# Patient Record
Sex: Male | Born: 1950 | Race: White | Hispanic: No | Marital: Married | Smoking: Never smoker
Health system: Southern US, Community
[De-identification: ages and names within clinical notes are randomized; demographics above are authoritative.]

## PROBLEM LIST (undated history)

## (undated) DIAGNOSIS — I10 Essential (primary) hypertension: Secondary | ICD-10-CM

## (undated) DIAGNOSIS — Z87442 Personal history of urinary calculi: Secondary | ICD-10-CM

## (undated) DIAGNOSIS — L570 Actinic keratosis: Secondary | ICD-10-CM

## (undated) DIAGNOSIS — H33312 Horseshoe tear of retina without detachment, left eye: Secondary | ICD-10-CM

## (undated) DIAGNOSIS — K219 Gastro-esophageal reflux disease without esophagitis: Secondary | ICD-10-CM

## (undated) HISTORY — PX: EYE SURGERY: SHX253

## (undated) HISTORY — PX: LITHOTRIPSY: SUR834

## (undated) HISTORY — PX: CHOLECYSTECTOMY: SHX55

## (undated) HISTORY — DX: Actinic keratosis: L57.0

## (undated) HISTORY — PX: VASECTOMY: SHX75

---

## 1993-02-18 HISTORY — PX: FOOT SURGERY: SHX648

## 2005-07-16 ENCOUNTER — Emergency Department: Payer: Self-pay | Admitting: Emergency Medicine

## 2018-11-29 ENCOUNTER — Other Ambulatory Visit: Payer: Self-pay

## 2018-11-29 ENCOUNTER — Emergency Department: Payer: 59

## 2018-11-29 ENCOUNTER — Emergency Department
Admission: EM | Admit: 2018-11-29 | Discharge: 2018-11-29 | Disposition: A | Discharge status: Home / Self Care | Payer: 59 | Attending: Emergency Medicine | Admitting: Emergency Medicine

## 2018-11-29 DIAGNOSIS — N200 Calculus of kidney: Secondary | ICD-10-CM | POA: Diagnosis not present

## 2018-11-29 DIAGNOSIS — N23 Unspecified renal colic: Secondary | ICD-10-CM | POA: Diagnosis not present

## 2018-11-29 DIAGNOSIS — Z79899 Other long term (current) drug therapy: Secondary | ICD-10-CM | POA: Diagnosis not present

## 2018-11-29 DIAGNOSIS — R1032 Left lower quadrant pain: Secondary | ICD-10-CM | POA: Diagnosis present

## 2018-11-29 LAB — CBC WITH DIFFERENTIAL/PLATELET
Abs Immature Granulocytes: 0.05 10*3/uL (ref 0.00–0.07)
Basophils Absolute: 0.1 10*3/uL (ref 0.0–0.1)
Basophils Relative: 1 %
Eosinophils Absolute: 0.6 10*3/uL — ABNORMAL HIGH (ref 0.0–0.5)
Eosinophils Relative: 6 %
HCT: 43.9 % (ref 39.0–52.0)
Hemoglobin: 15 g/dL (ref 13.0–17.0)
Immature Granulocytes: 1 %
Lymphocytes Relative: 28 %
Lymphs Abs: 2.6 10*3/uL (ref 0.7–4.0)
MCH: 31.4 pg (ref 26.0–34.0)
MCHC: 34.2 g/dL (ref 30.0–36.0)
MCV: 92 fL (ref 80.0–100.0)
Monocytes Absolute: 0.7 10*3/uL (ref 0.1–1.0)
Monocytes Relative: 8 %
Neutro Abs: 5.4 10*3/uL (ref 1.7–7.7)
Neutrophils Relative %: 56 %
Platelets: 168 10*3/uL (ref 150–400)
RBC: 4.77 MIL/uL (ref 4.22–5.81)
RDW: 11.6 % (ref 11.5–15.5)
WBC: 9.3 10*3/uL (ref 4.0–10.5)
nRBC: 0 % (ref 0.0–0.2)

## 2018-11-29 LAB — URINALYSIS, COMPLETE (UACMP) WITH MICROSCOPIC
Bacteria, UA: NONE SEEN
Bilirubin Urine: NEGATIVE
Glucose, UA: NEGATIVE mg/dL
Ketones, ur: NEGATIVE mg/dL
Leukocytes,Ua: NEGATIVE
Nitrite: NEGATIVE
Protein, ur: 30 mg/dL — AB
RBC / HPF: >50 RBC/hpf — ABNORMAL HIGH (ref 0–5)
Specific Gravity, Urine: 1.026 (ref 1.005–1.030)
Squamous Epithelial / HPF: NONE SEEN (ref 0–5)
pH: 5 (ref 5.0–8.0)

## 2018-11-29 LAB — BASIC METABOLIC PANEL
Anion gap: 9 (ref 5–15)
BUN: 22 mg/dL (ref 8–23)
CO2: 23 mmol/L (ref 22–32)
Calcium: 9 mg/dL (ref 8.9–10.3)
Chloride: 105 mmol/L (ref 98–111)
Creatinine, Ser: 0.84 mg/dL (ref 0.61–1.24)
GFR calc Af Amer: >60 mL/min (ref >60)
GFR calc non Af Amer: >60 mL/min (ref >60)
Glucose, Bld: 127 mg/dL — ABNORMAL HIGH (ref 70–99)
Potassium: 3.9 mmol/L (ref 3.5–5.1)
Sodium: 137 mmol/L (ref 135–145)

## 2018-11-29 MED ORDER — IBUPROFEN 600 MG PO TABS: 600.0000 mg | ORAL_TABLET | Freq: Three times a day (TID) | ORAL | 0 refills | Status: DC | PRN

## 2018-11-29 MED ORDER — HYDROCODONE-ACETAMINOPHEN 5-325 MG PO TABS: 1.0000 | ORAL_TABLET | ORAL | 0 refills | Status: DC | PRN

## 2018-11-29 MED ORDER — ONDANSETRON 4 MG PO TBDP: 4.0000 mg | ORAL_TABLET | Freq: Three times a day (TID) | ORAL | 0 refills | Status: DC | PRN

## 2018-11-29 MED ORDER — KETOROLAC TROMETHAMINE 30 MG/ML IJ SOLN
15.0000 mg | Freq: Once | INTRAMUSCULAR | Status: AC
  Administered 2018-11-29: 15 mg via INTRAVENOUS
  Filled 2018-11-29: qty 1

## 2018-11-29 MED ORDER — SODIUM CHLORIDE 0.9 % IV BOLUS
1000.0000 mL | Freq: Once | INTRAVENOUS | Status: AC
  Administered 2018-11-29: 1000 mL via INTRAVENOUS

## 2018-11-29 NOTE — ED Triage Notes (Signed)
C/O left flank pain.  Initially pain started last night, but has worsened today.  Also has been unable to void since last night.  Patient states he has had kidney stones in the past and symptoms are similar to that.

## 2018-11-29 NOTE — ED Triage Notes (Signed)
Pt reports a tinge of pain last night that returned this am at 0900, pt states that his left flank is where pt reports he has been unable to void since last pm, only dribbiling

## 2018-11-29 NOTE — ED Notes (Signed)
Patient transported to CT 

## 2018-11-29 NOTE — ED Provider Notes (Signed)
[facility] Emergency Department Provider Note  ____________________________________________   First MD Initiated Contact with Patient 11/29/18 1053     (approximate)  I have reviewed the triage vital signs and the nursing notes.   HISTORY  Chief Complaint Flank Pain    HPI FINDLEY VI is a 68 y.o. male here with transient flank pain.   Patient states that starting yesterday/last evening, he developed initially mild left flank pain.  Since then, he has had recurrence and worsening of severe left flank pain.  He states that he has had associated nausea but no vomiting.  He feels like the pain is aching, gnawing.  Has history of kidney stones with similar symptoms.  He denies any fever or chills.  No recent cough.  No other complaints.  He states that since arriving in the ED and receiving medication, he feels improved.  No history of infected stones.  He has required a stent in the past, but it has been a significant time.  No recent medication changes.  No cough.  No other illness.       No past medical history on file.   PMHx: Kidney stones  PShx:  H/o prior stenting/lithotripsy  SHx: Non smoker No ETOH regularly  There are no active problems to display for this patient.    Prior to Admission medications   Medication Sig Start Date End Date Taking? Authorizing Provider  HYDROcodone-acetaminophen (NORCO/VICODIN) 5-325 MG tablet Take 1-2 tablets by mouth every 4 (four) hours as needed for moderate pain or severe pain. 11/29/18 11/29/19  Duffy Bruce, MD  ibuprofen (ADVIL) 600 MG tablet Take 1 tablet (600 mg total) by mouth every 8 (eight) hours as needed for moderate pain. 11/29/18   Duffy Bruce, MD  ondansetron (ZOFRAN ODT) 4 MG disintegrating tablet Take 1 tablet (4 mg total) by mouth every 8 (eight) hours as needed for nausea or vomiting. 11/29/18   Duffy Bruce, MD    Allergies Penicillins  No family history on file.   Social History Social History   Tobacco Use  . Smoking status: Not on file  Substance Use Topics  . Alcohol use: Not on file  . Drug use: Not on file    Review of Systems  Review of Systems  Constitutional: Positive for fatigue. Negative for chills and fever.  HENT: Negative for sore throat.   Respiratory: Negative for shortness of breath.   Cardiovascular: Negative for chest pain.  Gastrointestinal: Negative for abdominal pain.  Genitourinary: Positive for flank pain and frequency.  Musculoskeletal: Negative for neck pain.  Skin: Negative for rash and wound.  Allergic/Immunologic: Negative for immunocompromised state.  Neurological: Negative for weakness and numbness.  Hematological: Does not bruise/bleed easily.  All other systems reviewed and are negative.    ____________________________________________  PHYSICAL EXAM:      VITAL SIGNS: ED Triage Vitals  Enc Vitals Group     BP 11/29/18 1040 (!) 146/89     Pulse Rate 11/29/18 1040 62     Resp 11/29/18 1040 16     Temp --      Temp src --      SpO2 11/29/18 1040 99 %     Weight 11/29/18 1030 206 lb (93.4 kg)     Height 11/29/18 1030 5\' 11"  (1.803 m)     Head Circumference --      Peak Flow --      Pain Score 11/29/18 1029 10     Pain Loc --  Pain Edu? --      Excl. in GC? --      Physical Exam Vitals signs and nursing note reviewed.  Constitutional:      General: He is not in acute distress.    Appearance: He is well-developed.  HENT:     Head: Normocephalic and atraumatic.  Eyes:     Conjunctiva/sclera: Conjunctivae normal.  Neck:     Musculoskeletal: Neck supple.  Cardiovascular:     Rate and Rhythm: Normal rate and regular rhythm.     Heart sounds: Normal heart sounds. No murmur. No friction rub.  Pulmonary:     Effort: Pulmonary effort is normal. No respiratory distress.     Breath sounds: Normal breath sounds. No wheezing or rales.  Abdominal:     General: Abdomen is flat. There is no  distension.     Palpations: Abdomen is soft.     Tenderness: There is no abdominal tenderness.     Comments: No CVA tenderness bilaterally.  No focal tenderness.  No rebound or guarding.  Skin:    General: Skin is warm.     Capillary Refill: Capillary refill takes less than 2 seconds.  Neurological:     Mental Status: He is alert and oriented to person, place, and time.     Motor: No abnormal muscle tone.       ____________________________________________   LABS (all labs ordered are listed, but only abnormal results are displayed)  Labs Reviewed  CBC WITH DIFFERENTIAL/PLATELET - Abnormal; Notable for the following components:      Result Value   Eosinophils Absolute 0.6 (*)    All other components within normal limits  BASIC METABOLIC PANEL - Abnormal; Notable for the following components:   Glucose, Bld 127 (*)    All other components within normal limits  URINALYSIS, COMPLETE (UACMP) WITH MICROSCOPIC - Abnormal; Notable for the following components:   Color, Urine YELLOW (*)    APPearance HAZY (*)    Hgb urine dipstick LARGE (*)    Protein, ur 30 (*)    RBC / HPF >50 (*)    All other components within normal limits    ____________________________________________  EKG: None ________________________________________  RADIOLOGY All imaging, including plain films, CT scans, and ultrasounds, independently reviewed by me, and interpretations confirmed via formal radiology reads.  ED MD interpretation:   CT stone: Bladder stone noted with mild dilation of the left ureter, consistent with likely recently passed stone  Official radiology report(s): Ct Renal Stone Study  Result Date: 11/29/2018 CLINICAL DATA:  Pt states he woke up at 2am with severe left sided flank pain, walking eased it off, severe pain returned at 9am. States hx kidney stones x4 left side most recent 6010yrs ago. EXAM: CT ABDOMEN AND PELVIS WITHOUT CONTRAST TECHNIQUE: Multidetector CT imaging of the abdomen  and pelvis was performed following the standard protocol without IV contrast. COMPARISON:  None. FINDINGS: Lower chest: Small ground-glass opacity in the peripheral right lower lobe (series 2, image 12), may represent a focus of atelectasis but early infection or inflammation not excluded. Somewhat limited evaluation of the abdominal viscera given the lack of IV contrast. Hepatobiliary: No focal liver abnormality is seen. Status post cholecystectomy. No biliary dilatation. Pancreas: Unremarkable. Spleen: Normal in size without focal abnormality. Adrenals/Urinary Tract: Adrenal glands are unremarkable. Kidneys are symmetric in size. No hydronephrosis. There is a 5 mm calculus in the right kidney. No renal calculi in the left kidney. The distal aspect of the left  ureter is minimally distended without an obstructing calculus. There is a 3 mm calculus in the dependent portion of the bladder. Stomach/Bowel: Stomach is within normal limits. Appendix appears normal. No evidence of bowel wall thickening, distention, or inflammatory changes. Multiple colonic diverticula without evidence of diverticulitis. Vascular/Lymphatic: Mild scattered atherosclerotic calcification without evidence of aneurysm. Reproductive: Prostate is unremarkable. Other: No abdominal wall hernia or abnormality. No abdominopelvic ascites. Musculoskeletal: No acute or significant osseous findings. IMPRESSION: 1. The distal aspect of the left ureter is minimally distended without an obstructing calculus. There is a 3 mm calculus in the dependent portion the bladder, possibly representing a recently passed stone. No left hydronephrosis. 2. 5 mm nonobstructing calculus in the right kidney. 3. Small ground-glass opacity in the peripheral right lower lobe (series 2, image 12), may represent a focus of atelectasis but early infection or inflammation not excluded. Consider short-term follow-up chest radiograph. 4. Colonic diverticulosis without evidence of  diverticulitis. Aortic Atherosclerosis (ICD10-I70.0). Electronically Signed   By: Emmaline Kluver M.D.   On: 11/29/2018 12:05    ____________________________________________  PROCEDURES   Procedure(s) performed (including Critical Care):  Procedures  ____________________________________________  INITIAL IMPRESSION / MDM / ASSESSMENT AND PLAN / ED COURSE  As part of my medical decision making, I reviewed the following data within the electronic MEDICAL RECORD NUMBER       *RODRIGUES URBANEK was evaluated in Emergency Department on 11/29/2018 for the symptoms described in the history of present illness. He was evaluated in the context of the global COVID-19 pandemic, which necessitated consideration that the patient might be at risk for infection with the SARS-CoV-2 virus that causes COVID-19. Institutional protocols and algorithms that pertain to the evaluation of patients at risk for COVID-19 are in a state of rapid change based on information released by regulatory bodies including the CDC and federal and state organizations. These policies and algorithms were followed during the patient's care in the ED.  Some ED evaluations and interventions may be delayed as a result of limited staffing during the pandemic.*      Medical Decision Making: 68 year old male here with left flank pain, now resolved.  Suspect passed stone.  There is a 3 mm stone in the bladder with mild ureter dilation consistent with recent passage.  His pain is now resolved.  He has no significant pyuria.  He is afebrile with no evidence to suggest infected stone or pyelonephritis.  No testicular pain or swelling.  Incidental note is made of possible atelectasis versus infection in his right lower lobe, but he has absolutely no fever, cough, shortness of breath, and I suspect this is more so atelectasis due to decreased movement from his pain.  Given otherwise well appearance with no signs of ongoing pain or obstruction, will  discharge with outpatient follow-up.  ____________________________________________  FINAL CLINICAL IMPRESSION(S) / ED DIAGNOSES  Final diagnoses:  Renal colic  Nephrolithiasis     MEDICATIONS GIVEN DURING THIS VISIT:  Medications  ketorolac (TORADOL) 30 MG/ML injection 15 mg (15 mg Intravenous Given 11/29/18 1113)  sodium chloride 0.9 % bolus 1,000 mL (0 mLs Intravenous Stopped 11/29/18 1242)     ED Discharge Orders         Ordered    HYDROcodone-acetaminophen (NORCO/VICODIN) 5-325 MG tablet  Every 4 hours PRN     11/29/18 1319    ondansetron (ZOFRAN ODT) 4 MG disintegrating tablet  Every 8 hours PRN     11/29/18 1319    ibuprofen (ADVIL) 600 MG  tablet  Every 8 hours PRN     11/29/18 1319           Note:  This document was prepared using Dragon voice recognition software and may include unintentional dictation errors.   Shaune Pollack, MD 11/29/18 1320

## 2019-01-05 ENCOUNTER — Other Ambulatory Visit: Payer: Self-pay | Admitting: Podiatry

## 2019-01-05 DIAGNOSIS — S86312D Strain of muscle(s) and tendon(s) of peroneal muscle group at lower leg level, left leg, subsequent encounter: Secondary | ICD-10-CM

## 2019-01-18 ENCOUNTER — Ambulatory Visit
Admission: RE | Admit: 2019-01-18 | Discharge: 2019-01-18 | Disposition: A | Discharge status: Home / Self Care | Payer: 59 | Source: Ambulatory Visit | Attending: Podiatry | Admitting: Podiatry

## 2019-01-18 ENCOUNTER — Other Ambulatory Visit: Payer: Self-pay

## 2019-01-18 DIAGNOSIS — S86312D Strain of muscle(s) and tendon(s) of peroneal muscle group at lower leg level, left leg, subsequent encounter: Secondary | ICD-10-CM | POA: Diagnosis not present

## 2019-01-21 ENCOUNTER — Other Ambulatory Visit: Payer: Self-pay | Admitting: Podiatry

## 2019-01-26 ENCOUNTER — Other Ambulatory Visit: Payer: Self-pay

## 2019-01-26 ENCOUNTER — Encounter
Admission: RE | Admit: 2019-01-26 | Discharge: 2019-01-26 | Disposition: A | Discharge status: Home / Self Care | Payer: 59 | Source: Ambulatory Visit | Attending: Podiatry | Admitting: Podiatry

## 2019-01-26 DIAGNOSIS — I4510 Unspecified right bundle-branch block: Secondary | ICD-10-CM | POA: Insufficient documentation

## 2019-01-26 DIAGNOSIS — Z20828 Contact with and (suspected) exposure to other viral communicable diseases: Secondary | ICD-10-CM | POA: Diagnosis not present

## 2019-01-26 DIAGNOSIS — Z01818 Encounter for other preprocedural examination: Secondary | ICD-10-CM | POA: Diagnosis not present

## 2019-01-26 DIAGNOSIS — I10 Essential (primary) hypertension: Secondary | ICD-10-CM | POA: Insufficient documentation

## 2019-01-26 HISTORY — DX: Horseshoe tear of retina without detachment, left eye: H33.312

## 2019-01-26 HISTORY — DX: Essential (primary) hypertension: I10

## 2019-01-26 HISTORY — DX: Personal history of urinary calculi: Z87.442

## 2019-01-26 HISTORY — DX: Gastro-esophageal reflux disease without esophagitis: K21.9

## 2019-01-26 LAB — SARS CORONAVIRUS 2 (TAT 6-24 HRS): SARS Coronavirus 2: NEGATIVE

## 2019-01-26 MED ORDER — FAMOTIDINE 20 MG PO TABS: 20.0000 mg | ORAL_TABLET | Freq: Once | ORAL | Status: DC

## 2019-01-26 NOTE — Patient Instructions (Signed)
Your procedure is scheduled on: January 29, 2019 Friday  Report to Day Surgery on the 2nd floor of the [facility]. To find out your arrival time, please call [phone] between 1PM - 3PM on: Thursday January 28, 2019   REMEMBER: Instructions that are not followed completely may result in serious medical risk, up to and including death; or upon the discretion of your surgeon and anesthesiologist your surgery may need to be rescheduled.  Do not eat food after midnight the night before surgery.  No gum chewing, lozengers or hard candies.  You may however, drink CLEAR liquids up to 2 hours before you are scheduled to arrive for your surgery. Do not drink anything within 2 hours of the start of your surgery.  Clear liquids include: - water  - apple juice without pulp -CLEAR  gatorade - black coffee or tea (Do NOT add milk or creamers to the coffee or tea) Do NOT drink anything that is not on this list.  Type 1 and Type 2 diabetics should only drink water.  ENSURE PRE-SURGERY CARBOHYDRATE DRINK:  Complete drinking 3 hours prior to surgery.  No Alcohol for 24 hours before or after surgery.  No Smoking including e-cigarettes for 24 hours prior to surgery.  No chewable tobacco products for at least 6 hours prior to surgery.  No nicotine patches on the day of surgery.  On the morning of surgery brush your teeth with toothpaste and water, you may rinse your mouth with mouthwash if you wish. Do not swallow any toothpaste or mouthwash.  Notify your doctor if there is any change in your medical condition (cold, fever, infection).  Do not wear jewelry, make-up, hairpins, clips or nail polish.  Do not wear lotions, powders, or perfumes.   Do not shave 48 hours prior to surgery.   Contacts and dentures may not be worn into surgery.  Do not bring valuables to the hospital, including drivers license, insurance or credit cards.  [facility] is not responsible for any belongings or  valuables.   TAKE THESE MEDICATIONS THE MORNING OF SURGERY: OMEPRAZOLE (take one the night before and one on the morning of surgery - helps to prevent nausea after surgery.)  Use CHG Soap as directed on instruction sheet.  Follow recommendations from Cardiologist, Pulmonologist or PCP regarding stopping Aspirin, Coumadin, Plavix, Eliquis, Pradaxa, or Pletal.  Stop Anti-inflammatories (NSAIDS) such as Advil, Aleve, Ibuprofen, Motrin, Naproxen, Naprosyn and Aspirin based products such as Excedrin, Goodys Powder, BC Powder. (May take Tylenol or Acetaminophen if needed.)  Stop ANY OVER THE COUNTER supplements until after surgery. (May continue Vitamin D, Vitamin B, and multivitamin.)  Wear comfortable clothing (specific to your surgery type) to the hospital.  Plan for stool softeners for home use.   If you are being discharged the day of surgery, you will not be allowed to drive home. You will need a responsible adult to drive you home and stay with you that night.   If you are taking public transportation, you will need to have a responsible adult with you. Please confirm with your physician that it is acceptable to use public transportation.   Please call [phone] if you have any questions about these instructions.

## 2019-01-28 MED ORDER — CLINDAMYCIN PHOSPHATE 900 MG/50ML IV SOLN
900.0000 mg | INTRAVENOUS | Status: AC
  Administered 2019-01-29: 900 mg via INTRAVENOUS

## 2019-01-29 ENCOUNTER — Encounter
Admission: RE | Disposition: A | Discharge status: Home / Self Care | Payer: Self-pay | Source: Home / Self Care | Attending: Podiatry

## 2019-01-29 ENCOUNTER — Other Ambulatory Visit: Payer: Self-pay

## 2019-01-29 ENCOUNTER — Encounter: Payer: Self-pay | Admitting: Podiatry

## 2019-01-29 ENCOUNTER — Ambulatory Visit
Admission: RE | Admit: 2019-01-29 | Discharge: 2019-01-29 | Disposition: A | Discharge status: Home / Self Care | Payer: 59 | Attending: Podiatry | Admitting: Podiatry

## 2019-01-29 ENCOUNTER — Inpatient Hospital Stay: Payer: 59 | Admitting: Certified Registered Nurse Anesthetist

## 2019-01-29 DIAGNOSIS — I10 Essential (primary) hypertension: Secondary | ICD-10-CM | POA: Insufficient documentation

## 2019-01-29 DIAGNOSIS — X58XXXD Exposure to other specified factors, subsequent encounter: Secondary | ICD-10-CM | POA: Diagnosis not present

## 2019-01-29 DIAGNOSIS — Z7951 Long term (current) use of inhaled steroids: Secondary | ICD-10-CM | POA: Insufficient documentation

## 2019-01-29 DIAGNOSIS — S86312D Strain of muscle(s) and tendon(s) of peroneal muscle group at lower leg level, left leg, subsequent encounter: Secondary | ICD-10-CM | POA: Insufficient documentation

## 2019-01-29 DIAGNOSIS — K219 Gastro-esophageal reflux disease without esophagitis: Secondary | ICD-10-CM | POA: Diagnosis not present

## 2019-01-29 DIAGNOSIS — Z79899 Other long term (current) drug therapy: Secondary | ICD-10-CM | POA: Diagnosis not present

## 2019-01-29 HISTORY — PX: TENDON REPAIR: SHX5111

## 2019-01-29 HISTORY — PX: ANKLE RECONSTRUCTION: SHX1151

## 2019-01-29 SURGERY — RECONSTRUCTION, ANKLE
Anesthesia: General | Site: Ankle | Laterality: Left

## 2019-01-29 MED ORDER — SODIUM CHLORIDE 0.9 % IV SOLN
INTRAVENOUS | Status: DC | PRN
  Administered 2019-01-29: 50 ug/min via INTRAVENOUS

## 2019-01-29 MED ORDER — ONDANSETRON HCL 4 MG/2ML IJ SOLN
INTRAMUSCULAR | Status: DC | PRN
  Administered 2019-01-29: 4 mg via INTRAVENOUS

## 2019-01-29 MED ORDER — BUPIVACAINE LIPOSOME 1.3 % IJ SUSP
INTRAMUSCULAR | Status: AC
  Filled 2019-01-29: qty 20

## 2019-01-29 MED ORDER — BUPIVACAINE HCL (PF) 0.25 % IJ SOLN: INTRAMUSCULAR | Status: DC | PRN

## 2019-01-29 MED ORDER — OXYCODONE-ACETAMINOPHEN 5-325 MG PO TABS: 1.0000 | ORAL_TABLET | Freq: Four times a day (QID) | ORAL | 0 refills | Status: AC | PRN

## 2019-01-29 MED ORDER — PROPOFOL 10 MG/ML IV BOLUS
INTRAVENOUS | Status: AC
  Filled 2019-01-29: qty 20

## 2019-01-29 MED ORDER — ONDANSETRON HCL 4 MG/2ML IJ SOLN: 4.0000 mg | Freq: Four times a day (QID) | INTRAMUSCULAR | Status: DC | PRN

## 2019-01-29 MED ORDER — ONDANSETRON HCL 4 MG PO TABS: 4.0000 mg | ORAL_TABLET | Freq: Four times a day (QID) | ORAL | Status: DC | PRN

## 2019-01-29 MED ORDER — BUPIVACAINE-EPINEPHRINE (PF) 0.25% -1:200000 IJ SOLN
INTRAMUSCULAR | Status: DC | PRN
  Administered 2019-01-29: 10 mL

## 2019-01-29 MED ORDER — ACETAMINOPHEN 10 MG/ML IV SOLN
INTRAVENOUS | Status: DC | PRN
  Administered 2019-01-29: 1000 mg via INTRAVENOUS

## 2019-01-29 MED ORDER — LIDOCAINE HCL (CARDIAC) PF 100 MG/5ML IV SOSY
PREFILLED_SYRINGE | INTRAVENOUS | Status: DC | PRN
  Administered 2019-01-29: 80 mg via INTRAVENOUS

## 2019-01-29 MED ORDER — BUPIVACAINE HCL (PF) 0.25 % IJ SOLN
INTRAMUSCULAR | Status: AC
  Filled 2019-01-29: qty 30

## 2019-01-29 MED ORDER — LIDOCAINE HCL (PF) 1 % IJ SOLN
INTRAMUSCULAR | Status: AC
  Filled 2019-01-29: qty 30

## 2019-01-29 MED ORDER — POVIDONE-IODINE 7.5 % EX SOLN
Freq: Once | CUTANEOUS | Status: DC
  Filled 2019-01-29: qty 118

## 2019-01-29 MED ORDER — FENTANYL CITRATE (PF) 100 MCG/2ML IJ SOLN
INTRAMUSCULAR | Status: DC | PRN
  Administered 2019-01-29 (×4): 25 ug via INTRAVENOUS

## 2019-01-29 MED ORDER — BUPIVACAINE LIPOSOME 1.3 % IJ SUSP
INTRAMUSCULAR | Status: DC | PRN
  Administered 2019-01-29: 20 mL

## 2019-01-29 MED ORDER — MIDAZOLAM HCL 2 MG/2ML IJ SOLN
INTRAMUSCULAR | Status: AC
  Filled 2019-01-29: qty 2

## 2019-01-29 MED ORDER — OXYCODONE HCL 5 MG PO TABS: 5.0000 mg | ORAL_TABLET | Freq: Once | ORAL | Status: DC | PRN

## 2019-01-29 MED ORDER — DEXAMETHASONE SODIUM PHOSPHATE 10 MG/ML IJ SOLN
INTRAMUSCULAR | Status: DC | PRN
  Administered 2019-01-29: 10 mg via INTRAVENOUS

## 2019-01-29 MED ORDER — FENTANYL CITRATE (PF) 100 MCG/2ML IJ SOLN
INTRAMUSCULAR | Status: AC
  Filled 2019-01-29: qty 2

## 2019-01-29 MED ORDER — ACETAMINOPHEN 10 MG/ML IV SOLN
INTRAVENOUS | Status: AC
  Filled 2019-01-29: qty 100

## 2019-01-29 MED ORDER — PHENYLEPHRINE HCL (PRESSORS) 10 MG/ML IV SOLN
INTRAVENOUS | Status: DC | PRN
  Administered 2019-01-29: 100 ug via INTRAVENOUS
  Administered 2019-01-29: 50 ug via INTRAVENOUS
  Administered 2019-01-29: 100 ug via INTRAVENOUS

## 2019-01-29 MED ORDER — MIDAZOLAM HCL 2 MG/2ML IJ SOLN
INTRAMUSCULAR | Status: DC | PRN
  Administered 2019-01-29 (×2): 1 mg via INTRAVENOUS

## 2019-01-29 MED ORDER — BUPIVACAINE HCL (PF) 0.5 % IJ SOLN
INTRAMUSCULAR | Status: AC
  Filled 2019-01-29: qty 30

## 2019-01-29 MED ORDER — FENTANYL CITRATE (PF) 100 MCG/2ML IJ SOLN: 25.0000 ug | INTRAMUSCULAR | Status: DC | PRN

## 2019-01-29 MED ORDER — LACTATED RINGERS IV SOLN
INTRAVENOUS | Status: DC
  Administered 2019-01-29: 11:00:00 via INTRAVENOUS

## 2019-01-29 MED ORDER — CLINDAMYCIN PHOSPHATE 900 MG/50ML IV SOLN
INTRAVENOUS | Status: AC
  Filled 2019-01-29: qty 50

## 2019-01-29 MED ORDER — OXYCODONE HCL 5 MG/5ML PO SOLN: 5.0000 mg | Freq: Once | ORAL | Status: DC | PRN

## 2019-01-29 MED ORDER — PROPOFOL 10 MG/ML IV BOLUS
INTRAVENOUS | Status: DC | PRN
  Administered 2019-01-29: 170 mg via INTRAVENOUS

## 2019-01-29 MED ORDER — EPHEDRINE SULFATE 50 MG/ML IJ SOLN
INTRAMUSCULAR | Status: DC | PRN
  Administered 2019-01-29 (×3): 5 mg via INTRAVENOUS
  Administered 2019-01-29: 10 mg via INTRAVENOUS

## 2019-01-29 SURGICAL SUPPLY — 75 items
BLADE SURG 15 STRL LF DISP TIS (BLADE) ×4 IMPLANT
BLADE SURG 15 STRL SS (BLADE) ×2
BLADE SURG MINI STRL (BLADE) ×3 IMPLANT
BNDG COHESIVE 4X5 TAN STRL (GAUZE/BANDAGES/DRESSINGS) ×3 IMPLANT
BNDG CONFORM 2 STRL LF (GAUZE/BANDAGES/DRESSINGS) ×3 IMPLANT
BNDG CONFORM 3 STRL LF (GAUZE/BANDAGES/DRESSINGS) ×3 IMPLANT
BNDG ELASTIC 4X5.8 VLCR NS LF (GAUZE/BANDAGES/DRESSINGS) ×6 IMPLANT
BNDG ESMARK 4X12 TAN STRL LF (GAUZE/BANDAGES/DRESSINGS) IMPLANT
BNDG ESMARK 6X12 TAN STRL LF (GAUZE/BANDAGES/DRESSINGS) ×3 IMPLANT
BNDG GAUZE 4.5X4.1 6PLY STRL (MISCELLANEOUS) ×3 IMPLANT
CANISTER SUCT 1200ML W/VALVE (MISCELLANEOUS) ×3 IMPLANT
COVER WAND RF STERILE (DRAPES) ×3 IMPLANT
CUFF TOURN SGL QUICK 18X4 (TOURNIQUET CUFF) ×3 IMPLANT
CUFF TOURN SGL QUICK 24 (TOURNIQUET CUFF)
CUFF TRNQT CYL 24X4X16.5-23 (TOURNIQUET CUFF) IMPLANT
DRAPE C-ARM XRAY 36X54 (DRAPES) IMPLANT
DRAPE C-ARMOR (DRAPES) IMPLANT
DRAPE FLUOR MINI C-ARM 54X84 (DRAPES) IMPLANT
DURAPREP 26ML APPLICATOR (WOUND CARE) ×3 IMPLANT
ELECT REM PT RETURN 9FT ADLT (ELECTROSURGICAL) ×3
ELECTRODE REM PT RTRN 9FT ADLT (ELECTROSURGICAL) ×2 IMPLANT
GAUZE 4X4 16PLY RFD (DISPOSABLE) ×3 IMPLANT
GAUZE SPONGE 4X4 12PLY STRL (GAUZE/BANDAGES/DRESSINGS) ×3 IMPLANT
GAUZE XEROFORM 1X8 LF (GAUZE/BANDAGES/DRESSINGS) ×3 IMPLANT
GLOVE BIO SURGEON STRL SZ7.5 (GLOVE) ×3 IMPLANT
GLOVE INDICATOR 8.0 STRL GRN (GLOVE) ×3 IMPLANT
GOWN STRL REUS W/ TWL LRG LVL3 (GOWN DISPOSABLE) ×6 IMPLANT
GOWN STRL REUS W/TWL LRG LVL3 (GOWN DISPOSABLE) ×3
HANDLE YANKAUER SUCT BULB TIP (MISCELLANEOUS) ×3 IMPLANT
KIT TURNOVER KIT A (KITS) ×3 IMPLANT
LABEL OR SOLS (LABEL) IMPLANT
NDL MAYO CATGUT SZ5 (NEEDLE)
NDL SUT 5 .5 CRC TPR PNT MAYO (NEEDLE) IMPLANT
NEEDLE FILTER BLUNT 18X 1/2SAF (NEEDLE) ×1
NEEDLE FILTER BLUNT 18X1 1/2 (NEEDLE) ×2 IMPLANT
NEEDLE HYPO 25X1 1.5 SAFETY (NEEDLE) ×9 IMPLANT
NS IRRIG 500ML POUR BTL (IV SOLUTION) ×3 IMPLANT
PACK EXTREMITY ARMC (MISCELLANEOUS) ×3 IMPLANT
PAD CAST CTTN 4X4 STRL (SOFTGOODS) ×2 IMPLANT
PAD PREP 24X41 OB/GYN DISP (PERSONAL CARE ITEMS) IMPLANT
PADDING CAST COTTON 4X4 STRL (SOFTGOODS) ×1
PENCIL ELECTRO HAND CTR (MISCELLANEOUS) ×3 IMPLANT
RASP SM TEAR CROSS CUT (RASP) ×3 IMPLANT
SPLINT CAST 1 STEP 4X30 (MISCELLANEOUS) IMPLANT
SPLINT CAST 1 STEP 5X30 WHT (MISCELLANEOUS) ×3 IMPLANT
SPLINT FAST PLASTER 5X30 (CAST SUPPLIES)
SPLINT PLASTER CAST FAST 5X30 (CAST SUPPLIES) IMPLANT
SPONGE LAP 18X18 RF (DISPOSABLE) IMPLANT
STAPLER SKIN PROX 35W (STAPLE) IMPLANT
STOCKINETTE M/LG 89821 (MISCELLANEOUS) ×3 IMPLANT
STRAP SAFETY 5IN WIDE (MISCELLANEOUS) ×3 IMPLANT
STRIP CLOSURE SKIN 1/2X4 (GAUZE/BANDAGES/DRESSINGS) IMPLANT
SUT ETHIBOND 2-0 (SUTURE) ×12 IMPLANT
SUT ETHILON 3-0 FS-10 30 BLK (SUTURE) ×6
SUT MNCRL+ 5-0 VIOLET P-3 (SUTURE) IMPLANT
SUT MONOCRYL 5-0 (SUTURE)
SUT PDS 2-0 27IN (SUTURE) ×6 IMPLANT
SUT PDS AB 0 CT1 27 (SUTURE) IMPLANT
SUT PDS AB 2-0 CT1 27 (SUTURE) IMPLANT
SUT VIC AB 0 SH 27 (SUTURE) IMPLANT
SUT VIC AB 2-0 CT1 27 (SUTURE)
SUT VIC AB 2-0 CT1 TAPERPNT 27 (SUTURE) IMPLANT
SUT VIC AB 2-0 SH 27 (SUTURE) ×1
SUT VIC AB 2-0 SH 27XBRD (SUTURE) ×2 IMPLANT
SUT VIC AB 3-0 SH 27 (SUTURE) ×2
SUT VIC AB 3-0 SH 27X BRD (SUTURE) ×4 IMPLANT
SUT VIC AB 4-0 FS2 27 (SUTURE) IMPLANT
SUT VICRYL AB 3-0 FS1 BRD 27IN (SUTURE) IMPLANT
SUTURE EHLN 3-0 FS-10 30 BLK (SUTURE) ×4 IMPLANT
SWABSTK COMLB BENZOIN TINCTURE (MISCELLANEOUS) IMPLANT
SYR 10ML LL (SYRINGE) IMPLANT
SYR 3ML LL SCALE MARK (SYRINGE) ×3 IMPLANT
WAND TOPAZ MICRO DEBRIDER (MISCELLANEOUS) ×3 IMPLANT
WIRE MAGNUM (SUTURE) ×3 IMPLANT
WIRE Z .062 C-WIRE SPADE TIP (WIRE) IMPLANT

## 2019-01-29 NOTE — Discharge Instructions (Addendum)
AMBULATORY SURGERY  DISCHARGE INSTRUCTIONS   1) The drugs that you were given will stay in your system until tomorrow so for the next 24 hours you should not:  A) Drive an automobile B) Make any legal decisions C) Drink any alcoholic beverage   2) You may resume regular meals tomorrow.  Today it is better to start with liquids and gradually work up to solid foods.  You may eat anything you prefer, but it is better to start with liquids, then soup and crackers, and gradually work up to solid foods.   3) Please notify your doctor immediately if you have any unusual bleeding, trouble breathing, redness and pain at the surgery site, drainage, fever, or pain not relieved by medication.  4) Your post-operative visit with Dr.                                     is: Date:                        Time:    Please call to schedule your post-operative visit.  5) Additional Instructions: [place] DR. TROXLER, DR. Vickki Muff, AND DR. [place]   1. Take your medication as prescribed.  Pain medication should be taken only as needed.  2. Keep the dressing clean, dry and intact.  3. Keep your foot elevated above the heart level for the first 48 hours.  4. Walking to the bathroom and brief periods of walking are acceptable, unless we have instructed you to be non-weight bearing.  5. Always wear your post-op shoe when walking.  Always use your crutches if you are to be non-weight bearing.  6. Do not take a shower. Baths are permissible as long as the foot is kept out of the water.   7. Every hour you are awake:  - Bend your knee 15 times. - Flex foot 15 times - Massage calf 15 times  8. Call [facility] [phone]) if any of the following problems occur: - You develop a temperature or fever. - The bandage becomes saturated with blood. - Medication does not stop your  pain. - Injury of the foot occurs. - Any symptoms of infection including redness, odor, or red streaks running from wound.

## 2019-01-29 NOTE — Transfer of Care (Signed)
Immediate Anesthesia Transfer of Care Note  Patient: Jeffrey Chambers  Procedure(s) Performed: Williemae Natter LEFT (Left Ankle) FLEXOR TENDON REPAIR-SECOND LEFT (Left )  Patient Location: PACU  Anesthesia Type:General  Level of Consciousness: sedated  Airway & Oxygen Therapy: Patient Spontanous Breathing and Patient connected to face mask oxygen  Post-op Assessment: Report given to RN and Post -op Vital signs reviewed and stable  Post vital signs: Reviewed and stable  Last Vitals:  Vitals Value Taken Time  BP    Temp    Pulse 71 01/29/19 1541  Resp    SpO2 97 % 01/29/19 1541  Vitals shown include unvalidated device data.  Last Pain:  Vitals:   01/29/19 1024  TempSrc: Tympanic  PainSc: 0-No pain         Complications: No apparent anesthesia complications

## 2019-01-29 NOTE — Op Note (Signed)
Operative note   Surgeon:Quron Ruddy Lawyer: None    Preop diagnosis: 1.  Peroneus longus tendon rupture 2.  Peroneus brevis longitudinal tendon tear. 3.  Lateral ankle instability all left ankle    Postop diagnosis: Same    Procedure: 1.  Excision peroneus longus tendon with anastomosis to peroneus brevis tendon 2.  Repair peroneus longus tendon 3.  Brostrm Gould lateral ankle stabilization left ankle    EBL: Minimal    Anesthesia:local and general local consisted of a total of 20 cc of 0.25% bupivacaine with epinephrine and 20 cc of Exparel long-acting anesthetic infiltrated along the incision site    Hemostasis: Mid calf tourniquet inflated to 200 mmHg increased to 250 mmHg for a total of 60 minutes    Specimen: Torn peroneal tendon.    Complications: None    Operative indications:Jeffrey Chambers is an 68 y.o. that presents today for surgical intervention.  The risks/benefits/alternatives/complications have been discussed and consent has been given.    Procedure:  Patient was brought into the OR and placed on the operating table in thesupine position. After anesthesia was obtained theleft lower extremity was prepped and draped in usual sterile fashion.  Attention was directed to the lateral aspect of the left ankle where a longitudinal incision was performed along the posterior aspect of the fibula coursing to the fifth metatarsal base area.  Sharp and blunt dissection carried down to the peritenon.  The peritenon and peroneal retinaculum was incised.  At this time there was noted to be a completely ruptured peroneus longus tendon with a bulbous end.  This had retracted proximal to posterior to the ankle.  The distal aspect of the rupture was not found as this had retracted deep into the mid arch.  The bulbous peroneal longus tendon was then transected for reanastomosis to the peroneus brevis tendon.  The peroneus brevis tendon was evaluated and there was noted to be a  longitudinal split tear from the deep muscle belly that was within the fibular groove to distal to the fibular tip.  Deep muscle belly was resected.  The peroneus brevis split tear was debrided.  A tubular rising suture with a 3-0 Vicryl was used to reattach overlies the tendon.  At this time the peroneus longus tendon was retracted to approximately 50% of its excursion and anastomosed to the peroneus brevis while the foot was held in a neutral 90 degree position.  Anastomosis was performed with a 2-0 Ethibond.  Good stability was noted.  The wound was flushed with copious amounts of irrigation.  Closure was performed with 3-0 Vicryl for the tendon sheath and the peroneal retinaculum was reanastomosed with a 2-0 PDS in a pants over vest fashion.  Tension was directed to the anterolateral aspect of the left ankle where the extensor retinaculum was noted.  This was reflected distally.  The deep anterior talofibular ligament was then incised.  A pants over vest suture was placed through the ligament with a 2-0 Ethibond.  The extensor retinaculum was brought back more proximal along the fibula with a 2-0 Ethibond with a pants over vest fashion.  Good stability was noted.  All layers were then closed with a combination of 2-0 Vicryl 3-0 Vicryl and 3-0 nylon for skin.  The areas were infiltrated with local anesthetic as above.  The patient was placed in a cam boot with the foot in neutral 90 degree position.    Patient tolerated the procedure and anesthesia well.  Was  transported from the OR to the PACU with all vital signs stable and vascular status intact. To be discharged per routine protocol.  Will follow up in approximately 1 week in the outpatient clinic.

## 2019-01-29 NOTE — H&P (Signed)
  HISTORY AND PHYSICAL INTERVAL NOTE:  01/29/2019  1:09 PM  Jeffrey Chambers  has presented today for surgery, with the diagnosis of S86.312D PERONEAL TENDON TEAR LEFT.  The various methods of treatment have been discussed with the patient.  No guarantees were given.  After consideration of risks, benefits and other options for treatment, the patient has consented to surgery.  I have reviewed the patients' chart and labs.     A history and physical examination was performed in my office.  The patient was reexamined.  There have been no changes to this history and physical examination.  Jeffrey Chambers A

## 2019-01-29 NOTE — Anesthesia Post-op Follow-up Note (Signed)
Anesthesia QCDR form completed.        

## 2019-01-29 NOTE — Anesthesia Procedure Notes (Signed)
Procedure Name: LMA Insertion Date/Time: 01/29/2019 1:39 PM Performed by: Willette Alma, CRNA Pre-anesthesia Checklist: Patient identified, Patient being monitored, Timeout performed, Emergency Drugs available and Suction available Patient Re-evaluated:Patient Re-evaluated prior to induction Oxygen Delivery Method: Circle system utilized Preoxygenation: Pre-oxygenation with 100% oxygen Induction Type: IV induction Ventilation: Mask ventilation without difficulty LMA: LMA inserted LMA Size: 5.0 Tube type: Oral Number of attempts: 1 Placement Confirmation: positive ETCO2 and breath sounds checked- equal and bilateral Tube secured with: Tape Dental Injury: Teeth and Oropharynx as per pre-operative assessment

## 2019-01-29 NOTE — Anesthesia Preprocedure Evaluation (Addendum)
Anesthesia Evaluation  Patient identified by MRN, date of birth, ID band Patient awake    Reviewed: Allergy & Precautions, H&P , NPO status , Patient's Chart, lab work & pertinent test results  History of Anesthesia Complications Negative for: history of anesthetic complications  Airway Mallampati: II  TM Distance: >3 FB Neck ROM: full    Dental  (+) Teeth Intact   Pulmonary neg pulmonary ROS, neg shortness of breath, neg COPD, neg recent URI,           Cardiovascular hypertension, (-) angina(-) Past MI (-) dysrhythmias      Neuro/Psych negative neurological ROS  negative psych ROS   GI/Hepatic Neg liver ROS, GERD  ,  Endo/Other  negative endocrine ROS  Renal/GU      Musculoskeletal   Abdominal   Peds  Hematology negative hematology ROS (+)   Anesthesia Other Findings Past Medical History: No date: GERD (gastroesophageal reflux disease) No date: History of kidney stones No date: Hypertension No date: Retinal tear of left eye      Reproductive/Obstetrics negative OB ROS                           Anesthesia Physical Anesthesia Plan  ASA: II  Anesthesia Plan: General LMA   Post-op Pain Management:    Induction:   PONV Risk Score and Plan: Dexamethasone, Ondansetron and Treatment may vary due to age or medical condition  Airway Management Planned:   Additional Equipment:   Intra-op Plan:   Post-operative Plan:   Informed Consent: I have reviewed the patients History and Physical, chart, labs and discussed the procedure including the risks, benefits and alternatives for the proposed anesthesia with the patient or authorized representative who has indicated his/her understanding and acceptance.     Dental Advisory Given  Plan Discussed with: Anesthesiologist  Anesthesia Plan Comments:        Anesthesia Quick Evaluation

## 2019-02-01 NOTE — Anesthesia Postprocedure Evaluation (Signed)
Anesthesia Post Note  Patient: Jeffrey Chambers  Procedure(s) Performed: Williemae Natter LEFT (Left Ankle) FLEXOR TENDON REPAIR-SECOND LEFT (Left )  Patient location during evaluation: PACU Anesthesia Type: General Level of consciousness: awake and alert Pain management: pain level controlled Vital Signs Assessment: post-procedure vital signs reviewed and stable Respiratory status: spontaneous breathing, nonlabored ventilation and respiratory function stable Cardiovascular status: blood pressure returned to baseline and stable Postop Assessment: no apparent nausea or vomiting Anesthetic complications: no     Last Vitals:  Vitals:   01/29/19 1612 01/29/19 1626  BP: 132/83 127/82  Pulse: 76 79  Resp: 18 18  Temp: 36.8 C   SpO2: 96% 98%    Last Pain:  Vitals:   01/29/19 1626  TempSrc:   PainSc: 0-No pain                 Durenda Hurt

## 2019-02-02 LAB — SURGICAL PATHOLOGY

## 2019-02-04 NOTE — Discharge Summary (Signed)
Pt stable for d/c to home.

## 2019-02-09 ENCOUNTER — Encounter: Payer: Self-pay | Admitting: *Deleted

## 2020-05-16 IMAGING — MR MR ANKLE*L* W/O CM
5 series · 40 of 40 positions shown · non-contrast
Comparison: None.

CLINICAL DATA: Left ankle pain and swelling for 3 months

EXAM:
MRI OF THE LEFT ANKLE WITHOUT CONTRAST
TECHNIQUE: Multiplanar, multisequence MR imaging of the ankle was performed. No
intravenous contrast was administered.

[Series 3: PD fat-sat · axial · left · 3.0mm · 0.50mm/px · z∈[-69,+79]mm · 10 of 38 slices shown]
[im 1/38]
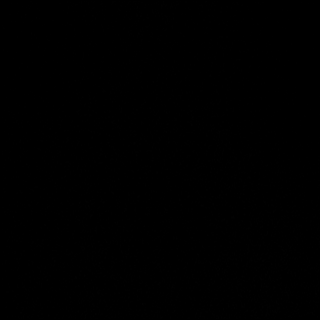
[im 5/38]
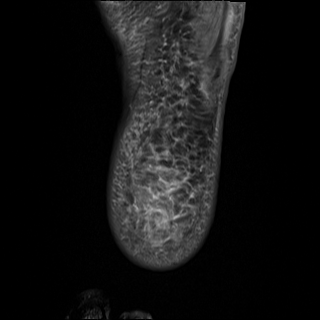
[im 9/38]
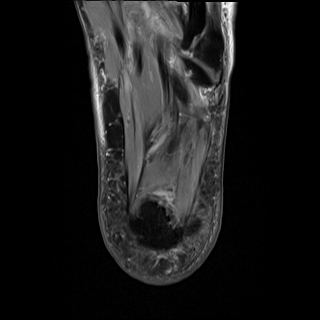
[im 13/38]
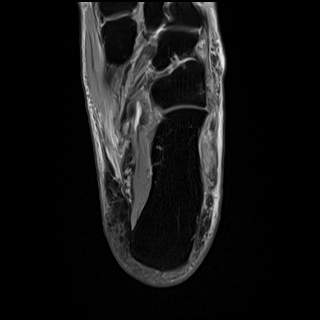
[im 17/38]
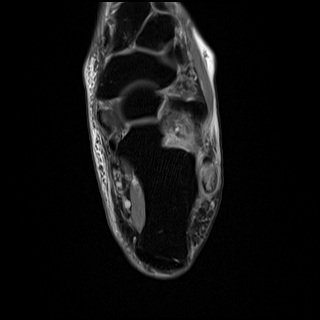
[im 21/38]
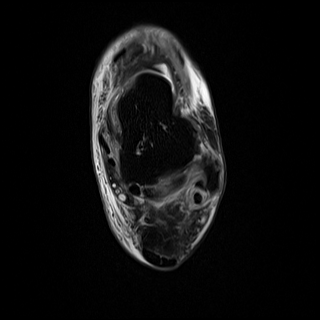
[im 25/38]
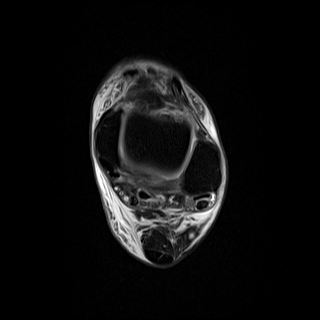
[im 29/38]
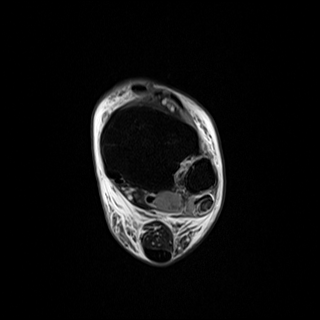
[im 33/38]
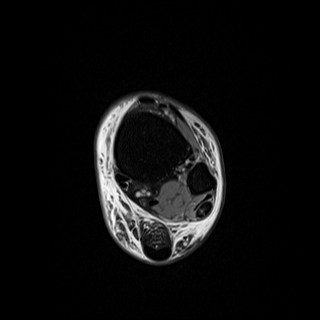
[im 38/38]
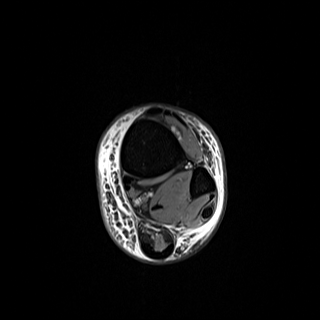

[Series 4: T2 fat-sat · axial · left · 3.0mm · 0.50mm/px · z∈[-69,+79]mm · 10 of 38 slices shown (1 of 2)]
[im 1/38]
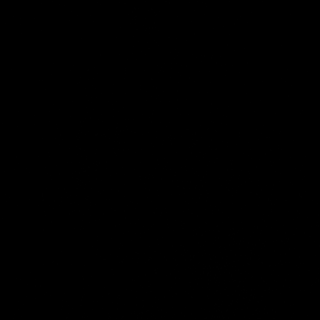
[im 5/38]
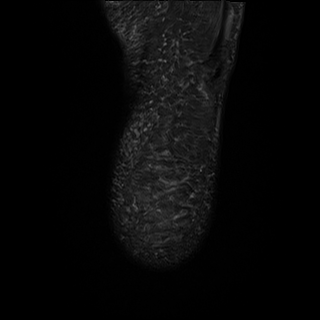
[im 9/38]
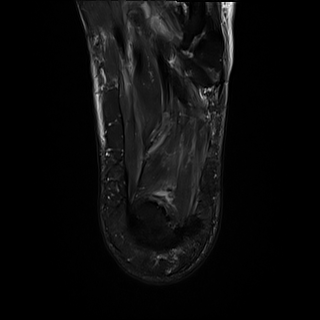
[im 13/38]
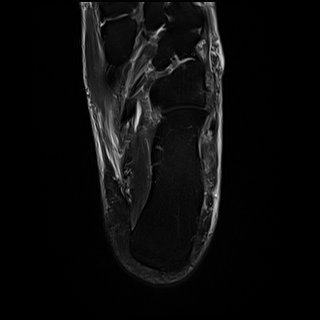
[im 17/38]
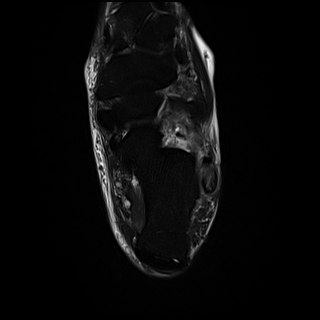
[im 21/38]
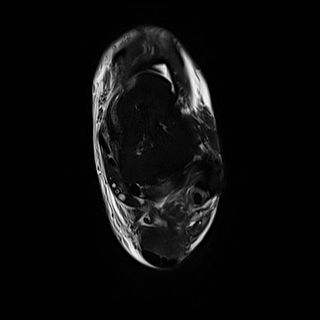
[im 25/38]
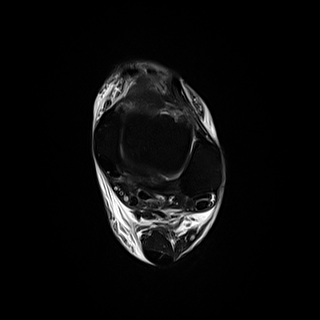
[im 29/38]
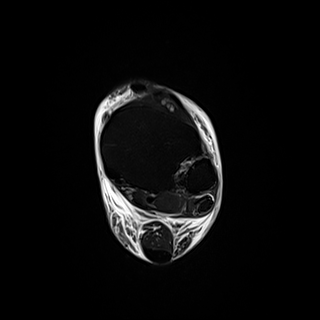
[im 33/38]
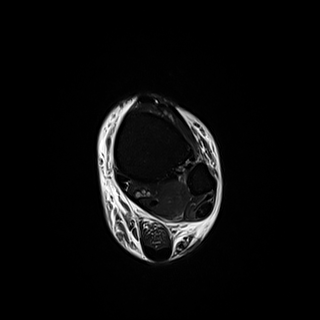
[im 38/38]
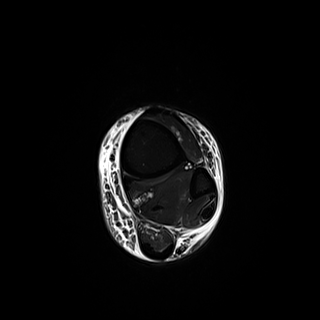

[Series 5: T2 fat-sat · coronal · left · 3.0mm · 0.62mm/px · 10 of 40 slices shown (2 of 2)]
[im 1/40]
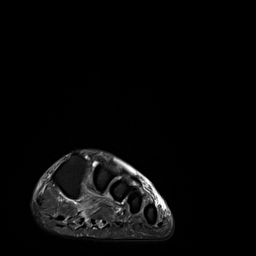
[im 5/40]
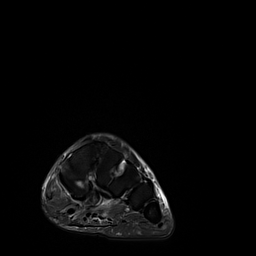
[im 9/40]
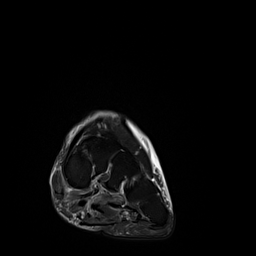
[im 14/40]
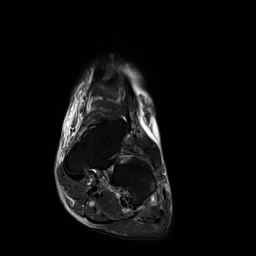
[im 18/40]
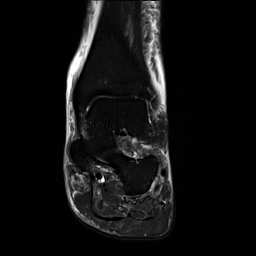
[im 22/40]
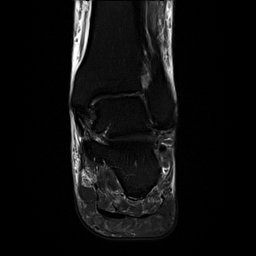
[im 27/40]
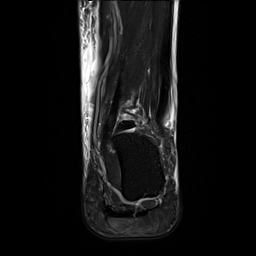
[im 31/40]
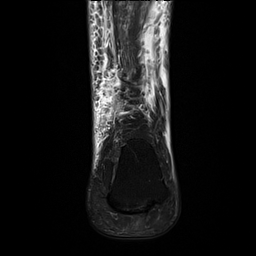
[im 35/40]
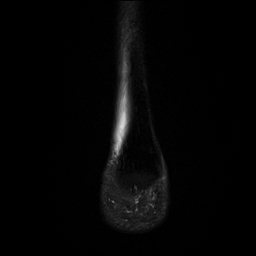
[im 40/40]
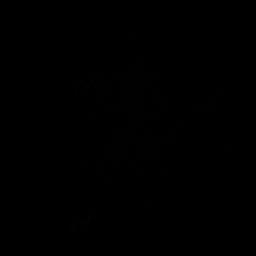

[Series 6: T1 · sagittal · left · 4.0mm · 0.70mm/px · 5 of 20 slices shown]
[im 1/20]
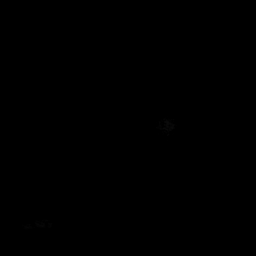
[im 5/20]
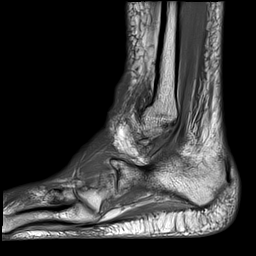
[im 10/20]
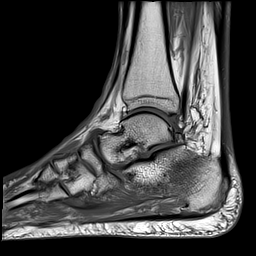
[im 15/20]
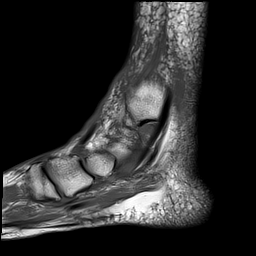
[im 20/20]
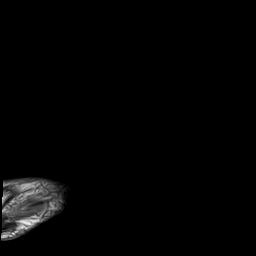

[Series 7: STIR · sagittal · left · 4.0mm · 0.35mm/px · 5 of 21 slices shown]
[im 1/21]
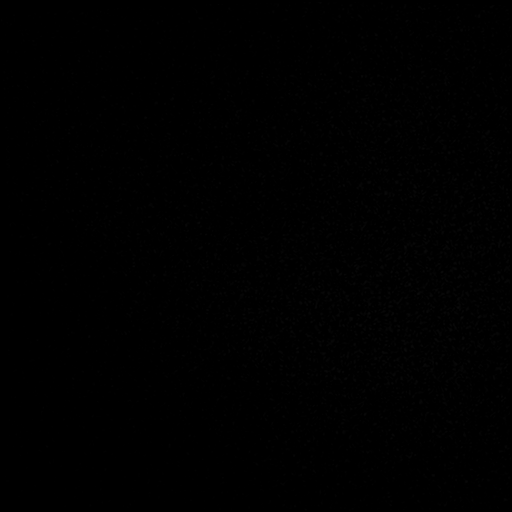
[im 6/21]
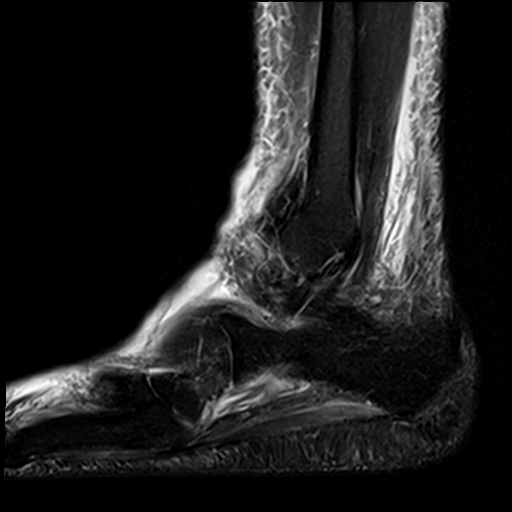
[im 11/21]
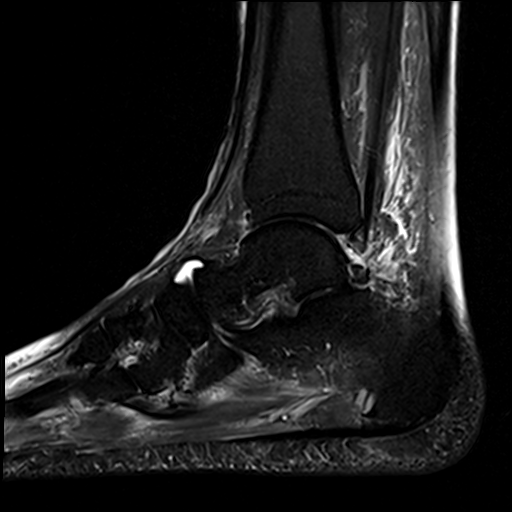
[im 16/21]
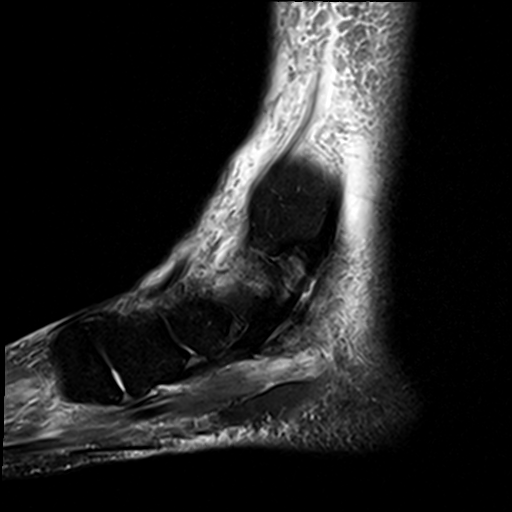
[im 21/21]
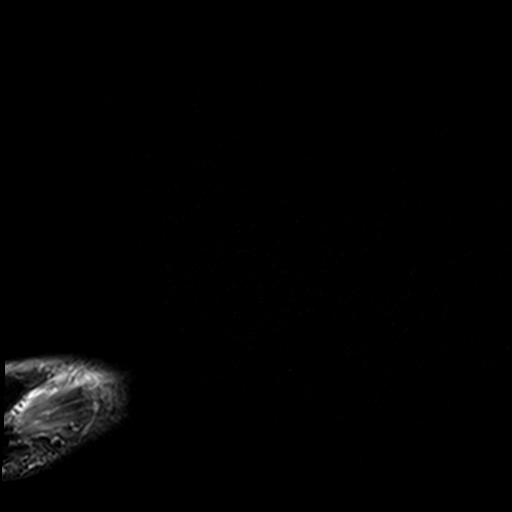

[40 of 40 positions shown; findings below may reference images not displayed]

FINDINGS: TENDONS

Peroneal: Complete tear of the peroneus longus tendon at the level
of the peroneal tubercle (series 3, images 22-25; series 7, images
17-18). The proximal portion of the tendon is markedly thickened and
irregular with tendinosis and partial tearing. The visualized distal
insertion of the tendon is intact but also appears markedly
heterogeneous. Peroneus brevis tendon intact. Trace fluid within the
peroneal tendon sheath.

Posteromedial: Intact tibialis posterior, flexor hallucis longus and
flexor digitorum longus tendons.

Anterior: Intact tibialis anterior, extensor hallucis longus and
extensor digitorum longus tendons.

Achilles: Intact.

Plantar Fascia: Intact.

LIGAMENTS

Lateral: Heterogeneous soft tissue in the region of the anterior
talofibular ligament, likely sequela of remote trauma. Posterior
talofibular ligament and calcaneofibular ligaments are also
thickened and irregular in appearance. The anterior and posterior
tibiofibular ligaments are intact.

Medial: Intact.

CARTILAGE

Ankle Joint: Cartilage thinning and surface irregularity with areas
of partial thickness cartilage loss. No discrete full-thickness
cartilage defect. No tibiotalar joint effusion.

Subtalar Joints/Sinus Tarsi: Mild cartilage thinning. No joint
effusion. Sinus tarsi unremarkable.

Bones: No marrow signal abnormality. No fracture or dislocation.

Other: Circumferential subcutaneous edema at the ankle. No abnormal
fluid collections.
IMPRESSION: 1. Complete tear of the peroneus longus tendon at the level of the
peroneal tubercle with tendinosis and partial tearing of the
proximal and distal margins of the torn tendon.
2. Heterogeneous appearance of the lateral ankle ligaments suggests
sequela of prior inversion ankle injury.
3. Mild tibiotalar osteoarthritis with partial-thickness cartilage
loss.
4. Circumferential subcutaneous edema at the ankle.

## 2020-06-26 ENCOUNTER — Emergency Department
Admission: EM | Admit: 2020-06-26 | Discharge: 2020-06-26 | Disposition: A | Discharge status: Home / Self Care | Payer: 59 | Attending: Emergency Medicine | Admitting: Emergency Medicine

## 2020-06-26 ENCOUNTER — Other Ambulatory Visit: Payer: Self-pay

## 2020-06-26 ENCOUNTER — Emergency Department: Payer: 59

## 2020-06-26 DIAGNOSIS — Z79899 Other long term (current) drug therapy: Secondary | ICD-10-CM | POA: Diagnosis not present

## 2020-06-26 DIAGNOSIS — F17290 Nicotine dependence, other tobacco product, uncomplicated: Secondary | ICD-10-CM | POA: Insufficient documentation

## 2020-06-26 DIAGNOSIS — K5792 Diverticulitis of intestine, part unspecified, without perforation or abscess without bleeding: Secondary | ICD-10-CM | POA: Insufficient documentation

## 2020-06-26 DIAGNOSIS — I10 Essential (primary) hypertension: Secondary | ICD-10-CM | POA: Insufficient documentation

## 2020-06-26 DIAGNOSIS — D72829 Elevated white blood cell count, unspecified: Secondary | ICD-10-CM | POA: Insufficient documentation

## 2020-06-26 DIAGNOSIS — R1032 Left lower quadrant pain: Secondary | ICD-10-CM | POA: Diagnosis present

## 2020-06-26 LAB — LIPASE, BLOOD: Lipase: 29 U/L (ref 11–51)

## 2020-06-26 LAB — CBC
HCT: 44.3 % (ref 39.0–52.0)
Hemoglobin: 15.2 g/dL (ref 13.0–17.0)
MCH: 32 pg (ref 26.0–34.0)
MCHC: 34.3 g/dL (ref 30.0–36.0)
MCV: 93.3 fL (ref 80.0–100.0)
Platelets: 155 10*3/uL (ref 150–400)
RBC: 4.75 MIL/uL (ref 4.22–5.81)
RDW: 12.4 % (ref 11.5–15.5)
WBC: 15.7 10*3/uL — ABNORMAL HIGH (ref 4.0–10.5)
nRBC: 0 % (ref 0.0–0.2)

## 2020-06-26 LAB — COMPREHENSIVE METABOLIC PANEL
ALT: 18 U/L (ref 0–44)
AST: 22 U/L (ref 15–41)
Albumin: 4.2 g/dL (ref 3.5–5.0)
Alkaline Phosphatase: 55 U/L (ref 38–126)
Anion gap: 10 (ref 5–15)
BUN: 14 mg/dL (ref 8–23)
CO2: 24 mmol/L (ref 22–32)
Calcium: 9.1 mg/dL (ref 8.9–10.3)
Chloride: 100 mmol/L (ref 98–111)
Creatinine, Ser: 0.71 mg/dL (ref 0.61–1.24)
GFR, Estimated: >60 mL/min (ref >60)
Glucose, Bld: 120 mg/dL — ABNORMAL HIGH (ref 70–99)
Potassium: 3.6 mmol/L (ref 3.5–5.1)
Sodium: 134 mmol/L — ABNORMAL LOW (ref 135–145)
Total Bilirubin: 1.8 mg/dL — ABNORMAL HIGH (ref 0.3–1.2)
Total Protein: 7.1 g/dL (ref 6.5–8.1)

## 2020-06-26 LAB — URINALYSIS, COMPLETE (UACMP) WITH MICROSCOPIC
Bacteria, UA: NONE SEEN
Bilirubin Urine: NEGATIVE
Glucose, UA: NEGATIVE mg/dL
Ketones, ur: 5 mg/dL — AB
Leukocytes,Ua: NEGATIVE
Nitrite: NEGATIVE
Protein, ur: NEGATIVE mg/dL
Specific Gravity, Urine: 1.015 (ref 1.005–1.030)
Squamous Epithelial / HPF: NONE SEEN (ref 0–5)
pH: 5 (ref 5.0–8.0)

## 2020-06-26 MED ORDER — IOHEXOL 300 MG/ML  SOLN
100.0000 mL | Freq: Once | INTRAMUSCULAR | Status: AC | PRN
  Administered 2020-06-26: 100 mL via INTRAVENOUS
  Filled 2020-06-26: qty 100

## 2020-06-26 MED ORDER — CIPROFLOXACIN HCL 500 MG PO TABS: 500.0000 mg | ORAL_TABLET | Freq: Two times a day (BID) | ORAL | 0 refills | Status: AC

## 2020-06-26 MED ORDER — CIPROFLOXACIN HCL 500 MG PO TABS
500.0000 mg | ORAL_TABLET | Freq: Once | ORAL | Status: AC
  Administered 2020-06-26: 500 mg via ORAL
  Filled 2020-06-26: qty 1

## 2020-06-26 MED ORDER — METRONIDAZOLE 500 MG PO TABS
500.0000 mg | ORAL_TABLET | Freq: Once | ORAL | Status: AC
  Administered 2020-06-26: 500 mg via ORAL
  Filled 2020-06-26: qty 1

## 2020-06-26 MED ORDER — SODIUM CHLORIDE 0.9 % IV BOLUS
1000.0000 mL | Freq: Once | INTRAVENOUS | Status: AC
  Administered 2020-06-26: 1000 mL via INTRAVENOUS

## 2020-06-26 MED ORDER — ONDANSETRON HCL 4 MG/2ML IJ SOLN
4.0000 mg | Freq: Once | INTRAMUSCULAR | Status: AC
  Administered 2020-06-26: 4 mg via INTRAVENOUS
  Filled 2020-06-26: qty 2

## 2020-06-26 MED ORDER — HYDROCODONE-ACETAMINOPHEN 5-325 MG PO TABS: 1.0000 | ORAL_TABLET | ORAL | 0 refills | Status: AC | PRN

## 2020-06-26 MED ORDER — MORPHINE SULFATE (PF) 4 MG/ML IV SOLN
4.0000 mg | Freq: Once | INTRAVENOUS | Status: AC
  Administered 2020-06-26: 4 mg via INTRAVENOUS
  Filled 2020-06-26: qty 1

## 2020-06-26 MED ORDER — METRONIDAZOLE 500 MG PO TABS: 500.0000 mg | ORAL_TABLET | Freq: Three times a day (TID) | ORAL | 0 refills | Status: AC

## 2020-06-26 NOTE — ED Notes (Signed)
Pt given warm blanket at this time 

## 2020-06-26 NOTE — ED Notes (Signed)
Says he has left lower quad/groin pain on and off for a while,but yesterday it go worse.  He hs no urinary sx.  He has been a bit constipated for a few weeks, but has been moving his bowels.  He is tender to palplation llq.

## 2020-06-26 NOTE — ED Provider Notes (Signed)
[facility] Emergency Department Provider Note  Time seen: 3:00 PM  I have reviewed the triage vital signs and the nursing notes.   HISTORY  Chief Complaint Abdominal Pain   HPI Jeffrey Chambers is a 70 y.o. male with a past medical history of gastric reflux, hypertension, presents emergency department for left lower quadrant abdominal pain.  According to the patient for the past 2 to 3 days he has been experiencing lower abdominal pain.  States he was lifting firewood and thought he might of pulled or strained something.  However today the pain worsened despite not doing any physical activity so he came to the emergency department for evaluation.  Denies any diarrhea or vomiting.  Denies any hematuria but states some slight urinary pressure at times.  Patient has had low-grade fever at home currently 100.3.   Past Medical History:  Diagnosis Date  . GERD (gastroesophageal reflux disease)   . History of kidney stones   . Hypertension   . Retinal tear of left eye     There are no problems to display for this patient.   Past Surgical History:  Procedure Laterality Date  . ANKLE RECONSTRUCTION Left 01/29/2019   Procedure: BRONSTRUM-GOULD LEFT;  Surgeon: Gwyneth Revels, DPM;  Location: [facility] ORS;  Service: Podiatry;  Laterality: Left;  . CHOLECYSTECTOMY    . EYE SURGERY     for left eye retinal tear  . FOOT SURGERY Left 1995  . LITHOTRIPSY Left    stents  . TENDON REPAIR Left 01/29/2019   Procedure: FLEXOR TENDON REPAIR-SECOND LEFT;  Surgeon: Gwyneth Revels, DPM;  Location: [facility] ORS;  Service: Podiatry;  Laterality: Left;  Marland Kitchen VASECTOMY      Prior to Admission medications   Medication Sig Start Date End Date Taking? Authorizing Provider  esomeprazole (NEXIUM) 20 MG packet Take 20 mg by mouth daily before breakfast.   Yes [provider]  fluticasone (FLONASE) 50 MCG/ACT nasal spray Place 1 spray into both nostrils daily as needed for allergies or  rhinitis.   Yes [provider]  lisinopril (ZESTRIL) 20 MG tablet Take 20 mg by mouth daily.   Yes [provider]  acetaminophen (TYLENOL) 500 MG tablet Take 1,000 mg by mouth every 6 (six) hours as needed for moderate pain or headache.    [provider]  Carboxymethylcellul-Glycerin (LUBRICATING EYE DROPS OP) Place 1 drop into both eyes daily as needed (dry eyes).    [provider]  omeprazole (PRILOSEC OTC) 20 MG tablet Take 20 mg by mouth daily.    [provider]    Allergies  Allergen Reactions  . Penicillins     Unknown reaction Did it involve swelling of the face/tongue/throat, SOB, or low BP? Unknown Did it involve sudden or severe rash/hives, skin peeling, or any reaction on the inside of your mouth or nose? Unknown Did you need to seek medical attention at a hospital or doctor's office? Unknown When did it last happen?Childhood allergy If all above answers are "NO", may proceed with cephalosporin use.     No family history on file.  Social History Social History   Tobacco Use  . Smoking status: Never Smoker  . Smokeless tobacco: Current User    Types: Chew  Vaping Use  . Vaping Use: Never used  Substance Use Topics  . Alcohol use: Never  . Drug use: Never    Review of Systems Constitutional: Low-grade fever at home. Cardiovascular: Negative for chest pain. Respiratory: Negative  for shortness of breath. Gastrointestinal: Left lower quadrant moderate dull aching abdominal pain.  Negative for vomiting or diarrhea. Genitourinary: Negative for urinary compaints Musculoskeletal: Negative for musculoskeletal complaints Neurological: Negative for headache All other ROS negative  ____________________________________________   PHYSICAL EXAM:  VITAL SIGNS: ED Triage Vitals [06/26/20 1335]  Enc Vitals Group     BP (!) 142/86     Pulse Rate 100     Resp 16     Temp 100.3 F (37.9 C)     Temp Source Oral      SpO2 100 %     Weight 180 lb (81.6 kg)     Height 5\' 9"  (1.753 m)     Head Circumference      Peak Flow      Pain Score 9     Pain Loc      Pain Edu?      Excl. in GC?     Constitutional: Alert and oriented. Well appearing and in no distress. Eyes: Normal exam ENT      Head: Normocephalic and atraumatic.      Mouth/Throat: Mucous membranes are moist. Cardiovascular: Normal rate, regular rhythm.  Respiratory: Normal respiratory effort without tachypnea nor retractions. Breath sounds are clear  Gastrointestinal: Soft, moderate left lower quadrant abdominal tenderness palpation without rebound guarding or distention.  Normal external GU/scrotal exam. Musculoskeletal: Nontender with normal range of motion in all extremities.  Neurologic:  Normal speech and language. No gross focal neurologic deficits Skin:  Skin is warm, dry and intact.  Psychiatric: Mood and affect are normal.    RADIOLOGY  CT consistent with acute uncomplicated diverticulitis  ____________________________________________   INITIAL IMPRESSION / ASSESSMENT AND PLAN / ED COURSE  Pertinent labs & imaging results that were available during my care of the patient were reviewed by me and considered in my medical decision making (see chart for details).   Patient presents emergency department left lower quadrant abdominal pain, moderate tenderness to palpation.  Low-grade fever at home.  Patient has an elevated white blood cell count of 15,000.  Suspect intra-abdominal pathology such as diverticulitis or colitis.  No signs of UTI or pyelonephritis based on the urine.  Inguinal hernia would also be in the differential, although no hernia palpated on my exam.  CT consistent with acute uncomplicated diverticulitis.  Given the CT findings we will start the patient on antibiotics.  He has a penicillin allergy we will do ciprofloxacin and Flagyl have the patient follow-up with his doctor.  I discussed return precautions for any  worsening abdominal pain, vomiting unable to keep down his antibiotics.  Patient agreeable to plan of care.  We will also prescribe a short course of pain medication for the patient.  Discussed dietary precautions  Jeffrey Chambers was evaluated in Emergency Department on 06/26/2020 for the symptoms described in the history of present illness. He was evaluated in the context of the global COVID-19 pandemic, which necessitated consideration that the patient might be at risk for infection with the SARS-CoV-2 virus that causes COVID-19. Institutional protocols and algorithms that pertain to the evaluation of patients at risk for COVID-19 are in a state of rapid change based on information released by regulatory bodies including the CDC and federal and state organizations. These policies and algorithms were followed during the patient's care in the ED.  ____________________________________________   FINAL CLINICAL IMPRESSION(S) / ED DIAGNOSES  Left lower quadrant abdominal pain Acute diverticulitis   08/26/2020, MD 06/26/20 1635

## 2020-06-26 NOTE — ED Triage Notes (Signed)
Pt states that he started with llq abd pain yesterday went to the dr today who sent him here, pt reports hx of kidney stones in the past, states that he has some pressure with voiding, pt denies n/v/d

## 2021-04-05 DIAGNOSIS — K5730 Diverticulosis of large intestine without perforation or abscess without bleeding: Secondary | ICD-10-CM | POA: Insufficient documentation

## 2021-04-05 DIAGNOSIS — I10 Essential (primary) hypertension: Secondary | ICD-10-CM | POA: Insufficient documentation

## 2024-03-11 ENCOUNTER — Encounter: Payer: Self-pay | Admitting: Dermatology

## 2024-03-11 ENCOUNTER — Ambulatory Visit (INDEPENDENT_AMBULATORY_CARE_PROVIDER_SITE_OTHER): Admitting: Dermatology

## 2024-03-11 DIAGNOSIS — W908XXA Exposure to other nonionizing radiation, initial encounter: Secondary | ICD-10-CM | POA: Diagnosis not present

## 2024-03-11 DIAGNOSIS — L578 Other skin changes due to chronic exposure to nonionizing radiation: Secondary | ICD-10-CM | POA: Diagnosis not present

## 2024-03-11 DIAGNOSIS — Z7189 Other specified counseling: Secondary | ICD-10-CM

## 2024-03-11 DIAGNOSIS — D1801 Hemangioma of skin and subcutaneous tissue: Secondary | ICD-10-CM

## 2024-03-11 DIAGNOSIS — L570 Actinic keratosis: Secondary | ICD-10-CM | POA: Diagnosis not present

## 2024-03-11 DIAGNOSIS — L821 Other seborrheic keratosis: Secondary | ICD-10-CM | POA: Diagnosis not present

## 2024-03-11 DIAGNOSIS — L720 Epidermal cyst: Secondary | ICD-10-CM | POA: Diagnosis not present

## 2024-03-11 DIAGNOSIS — L814 Other melanin hyperpigmentation: Secondary | ICD-10-CM | POA: Diagnosis not present

## 2024-03-11 DIAGNOSIS — D229 Melanocytic nevi, unspecified: Secondary | ICD-10-CM

## 2024-03-11 MED ORDER — FLUOROURACIL 5 % EX CREA: TOPICAL_CREAM | Freq: Two times a day (BID) | CUTANEOUS | 2 refills | Status: AC

## 2024-03-11 NOTE — Progress Notes (Signed)
 "  New Patient Visit   Subjective  Jeffrey Chambers is a 74 y.o. male who presents for the following: check sclay spots face, L post auricular, itchy prn, check spot abdomen, no hx of skin cancer, no fhx of skin cancer  Patient accompanied by wife who contributes to history.   The following portions of the chart were reviewed this encounter and updated as appropriate: medications, allergies, medical history  Review of Systems:  No other skin or systemic complaints except as noted in HPI or Assessment and Plan.  Objective  Well appearing patient in no apparent distress; mood and affect are within normal limits.   A focused examination was performed of the following areas: Face, scalp, neck, ears, abdomen, arms  Relevant exam findings are noted in the Assessment and Plan.    Assessment & Plan   SEBORRHEIC KERATOSIS Face, scalp - Stuck-on, waxy, tan-Armijo papules and/or plaques  - Benign-appearing - Discussed benign etiology and prognosis. - Observe - Call for any changes  HEMANGIOMA Exam: red papule(s) Discussed benign nature. Recommend observation. Call for changes.  LENTIGINES Exam: scattered tan macules Due to sun exposure Treatment Plan: Benign-appearing, observe. Recommend daily broad spectrum sunscreen SPF 30+ to sun-exposed areas, reapply every 2 hours as needed.  Call for any changes  ACTINIC DAMAGE - chronic, secondary to cumulative UV radiation exposure/sun exposure over time - diffuse scaly erythematous macules with underlying dyspigmentation - Recommend daily broad spectrum sunscreen SPF 30+ to sun-exposed areas, reapply every 2 hours as needed.  - Recommend staying in the shade or wearing long sleeves, sun glasses (UVA+UVB protection) and wide brim hats (4-inch brim around the entire circumference of the hat). - Call for new or changing lesions.   EPIDERMAL INCLUSION CYST abdomen Exam: Subcutaneous nodule at abdomen  Benign-appearing. Exam most consistent  with an epidermal inclusion cyst. Discussed that a cyst is a benign growth that can grow over time and sometimes get irritated or inflamed. Recommend observation if it is not bothersome. Discussed option of surgical excision to remove it if it is growing, symptomatic, or other changes noted. Please call for new or changing lesions so they can be evaluated.  ACTINIC DAMAGE WITH PRECANCEROUS ACTINIC KERATOSES Counseling for Topical Chemotherapy Management: Patient exhibits: - Severe, confluent actinic changes with pre-cancerous actinic keratoses that is secondary to cumulative UV radiation exposure over time - Condition that is severe; chronic, not at goal. - diffuse scaly erythematous macules and papules with underlying dyspigmentation - Discussed Prescription Field Treatment topical Chemotherapy for Severe, Chronic Confluent Actinic Changes with Pre-Cancerous Actinic Keratoses Field treatment involves treatment of an entire area of skin that has confluent Actinic Changes (Sun/ Ultraviolet light damage) and PreCancerous Actinic Keratoses by method of PhotoDynamic Therapy (PDT) and/or prescription Topical Chemotherapy agents such as 5-fluorouracil , 5-fluorouracil /calcipotriene, and/or imiquimod.  The purpose is to decrease the number of clinically evident and subclinical PreCancerous lesions to prevent progression to development of skin cancer by chemically destroying early precancer changes that may or may not be visible.  It has been shown to reduce the risk of developing skin cancer in the treated area. As a result of treatment, redness, scaling, crusting, and open sores may occur during treatment course. One or more than one of these methods may be used and may have to be used several times to control, suppress and eliminate the PreCancerous changes. Discussed treatment course, expected reaction, and possible side effects. - Recommend daily broad spectrum sunscreen SPF 30+ to sun-exposed areas, reapply  every  2 hours as needed.  - Staying in the shade or wearing long sleeves, sun glasses (UVA+UVB protection) and wide brim hats (4-inch brim around the entire circumference of the hat) are also recommended. - Call for new or changing lesions.  - Start 5-fluorouracil  cream twice a day until areas get red and irritated to affected areas including bil cheeks and temples.  Reviewed course of treatment and expected reaction.  Patient advised to expect inflammation and crusting and advised that erosions are possible.  Patient advised to be diligent with sun protection during and after treatment. Handout with details of how to apply medication and what to expect provided. Counseled to keep medication out of reach of children and pets.   SEBORRHEIC KERATOSES   ACTINIC KERATOSES   ACTINIC ELASTOSIS   LENTIGINES   MULTIPLE BENIGN NEVI   CHERRY ANGIOMA   EIC (EPIDERMAL INCLUSION CYST)    Return for 3-66m AK f/u.  I, Grayce Saunas, RMA, am acting as scribe for Boneta Sharps, MD .   Documentation: I have reviewed the above documentation for accuracy and completeness, and I agree with the above.  Boneta Sharps, MD    "

## 2024-03-11 NOTE — Patient Instructions (Addendum)
 - Start 5-fluorouracil  cream twice a day until areas get red and irritated to affected areas including bilateral cheeks and temples.  Reviewed course of treatment and expected reaction.  Patient advised to expect inflammation and crusting and advised that erosions are possible.  Patient advised to be diligent with sun protection during and after treatment. Handout with details of how to apply medication and what to expect provided. Counseled to keep medication out of reach of children and pets   Seborrheic Keratosis  What causes seborrheic keratoses? Seborrheic keratoses are harmless, common skin growths that first appear during adult life.  As time goes by, more growths appear.  Some people may develop a large number of them.  Seborrheic keratoses appear on both covered and uncovered body parts.  They are not caused by sunlight.  The tendency to develop seborrheic keratoses can be inherited.  They vary in color from skin-colored to gray, [place]ff[place], or even black.  They can be either smooth or have a rough, warty surface.   Seborrheic keratoses are superficial and look as if they were stuck on the skin.  Under the microscope this type of keratosis looks like layers upon layers of skin.  That is why at times the top layer may seem to fall off, but the rest of the growth remains and re-grows.    Treatment Seborrheic keratoses do not need to be treated, but can easily be removed in the office.  Seborrheic keratoses often cause symptoms when they rub on clothing or jewelry.  Lesions can be in the way of shaving.  If they become inflamed, they can cause itching, soreness, or burning.  Removal of a seborrheic keratosis can be accomplished by freezing, burning, or surgery. If any spot bleeds, scabs, or grows rapidly, please return to have it checked, as these can be an indication of a skin cancer.  Recommend daily broad spectrum sunscreen SPF 30+ to sun-exposed areas, reapply every 2 hours as needed. Call for new  or changing lesions.  Staying in the shade or wearing long sleeves, sun glasses (UVA+UVB protection) and wide brim hats (4-inch brim around the entire circumference of the hat) are also recommended for sun protection.    Due to recent changes in healthcare laws, you may see results of your pathology and/or laboratory studies on MyChart before the doctors have had a chance to review them. We understand that in some cases there may be results that are confusing or concerning to you. Please understand that not all results are received at the same time and often the doctors may need to interpret multiple results in order to provide you with the best plan of care or course of treatment. Therefore, we ask that you please give us  2 business days to thoroughly review all your results before contacting the office for clarification. Should we see a critical lab result, you will be contacted sooner.   If You Need Anything After Your Visit  If you have any questions or concerns for your doctor, please call our main line at [phone] and press option 4 to reach your doctor's medical assistant. If no one answers, please leave a voicemail as directed and we will return your call as soon as possible. Messages left after 4 pm will be answered the following business day.   You may also send us  a message via MyChart. We typically respond to MyChart messages within 1-2 business days.  For prescription refills, please ask your pharmacy to contact our office. Our fax  number is [phone].  If you have an urgent issue when the clinic is closed that cannot wait until the next business day, you can page your doctor at the number below.    Please note that while we do our best to be available for urgent issues outside of office hours, we are not available 24/7.   If you have an urgent issue and are unable to reach us , you may choose to seek medical care at your doctor's office, retail clinic, urgent care center, or  emergency room.  If you have a medical emergency, please immediately call 911 or go to the emergency department.  Pager Numbers  - Dr. Hester: [phone]  - Dr. Jackquline: [phone]  - Dr. Claudene: [phone]   - Dr. Raymund: [phone]  In the event of inclement weather, please call our main line at [phone] for an update on the status of any delays or closures.  Dermatology Medication Tips: Please keep the boxes that topical medications come in in order to help keep track of the instructions about where and how to use these. Pharmacies typically print the medication instructions only on the boxes and not directly on the medication tubes.   If your medication is too expensive, please contact our office at [phone] option 4 or send us  a message through MyChart.   We are unable to tell what your co-pay for medications will be in advance as this is different depending on your insurance coverage. However, we may be able to find a substitute medication at lower cost or fill out paperwork to get insurance to cover a needed medication.   If a prior authorization is required to get your medication covered by your insurance company, please allow us  1-2 business days to complete this process.  Drug prices often vary depending on where the prescription is filled and some pharmacies may offer cheaper prices.  The website www.goodrx.com contains coupons for medications through different pharmacies. The prices here do not account for what the cost may be with help from insurance (it may be cheaper with your insurance), but the website can give you the price if you did not use any insurance.  - You can print the associated coupon and take it with your prescription to the pharmacy.  - You may also stop by our office during regular business hours and pick up a GoodRx coupon card.  - If you need your prescription sent electronically to a different pharmacy, notify our office through [facility] or by phone at [phone] option 4.     Si Usted Necesita Algo Despus de Su Visita  Tambin puede enviarnos un mensaje a travs de Clinical Cytogeneticist. Por lo general respondemos a los mensajes de MyChart en el transcurso de 1 a 2 das hbiles.  Para renovar recetas, por favor pida a su farmacia que se ponga en contacto con nuestra oficina. Randi lakes de fax es [place] [phone].  Si tiene un asunto urgente cuando la clnica est cerrada y que no puede esperar hasta el siguiente da hbil, puede llamar/localizar a su doctor(a) al nmero que aparece a continuacin.   Por favor, tenga en cuenta que aunque hacemos todo lo posible para estar disponibles para asuntos urgentes fuera del horario de [place], no estamos disponibles las 24 horas del da, los 7 [address] de la [place].   Si tiene un problema urgente y no puede comunicarse con nosotros, puede optar por buscar atencin mdica  en el consultorio de su doctor(a), en ignacia  clnica privada, en un centro de atencin urgente o en una sala de emergencias.  Si tiene engineer, drilling, por favor llame inmediatamente al 911 o vaya a la sala de emergencias.  Nmeros de bper  - Dr. Hester: [phone]  - Dra. Jackquline: [phone]  - Dr. Claudene: [phone]  - Dra. Kitts: [phone]  En caso de inclemencias del [place], por favor llame a nuestra lnea principal al [phone] para una actualizacin sobre el estado de cualquier retraso o cierre.  Consejos para la medicacin en dermatologa: Por favor, guarde las cajas en las que vienen los medicamentos de uso tpico para ayudarle a seguir las instrucciones sobre dnde y cmo usarlos. Las farmacias generalmente imprimen las instrucciones del medicamento slo en las cajas y no directamente en los tubos del [place].   Si su medicamento es muy caro, por favor, pngase en contacto con landry rieger llamando al [phone] y presione la opcin 4 o envenos un mensaje a travs de  Clinical Cytogeneticist.   No podemos decirle cul ser su copago por los medicamentos por adelantado ya que esto es diferente dependiendo de la cobertura de su seguro. Sin embargo, es posible que podamos encontrar un medicamento sustituto a audiological scientist un formulario para que el seguro cubra el medicamento que se considera necesario.   Si se requiere una autorizacin previa para que su compaa de seguros cubra su medicamento, por favor permtanos de 1 a 2 das hbiles para completar este proceso.  Los precios de los medicamentos varan con frecuencia dependiendo del environmental consultant de dnde se surte la receta y alguna farmacias pueden ofrecer precios ms baratos.  El sitio web www.goodrx.com tiene cupones para medicamentos de health and safety inspector. Los precios aqu no tienen en cuenta lo que podra costar con la ayuda del seguro (puede ser ms barato con su seguro), pero el sitio web puede darle el precio si no utiliz tourist information centre manager.  - Puede imprimir el cupn correspondiente y llevarlo con su receta a la farmacia.  - Tambin puede pasar por nuestra oficina durante el horario de atencin regular y education officer, museum una tarjeta de cupones de GoodRx.  - Si necesita que su receta se enve electrnicamente a una farmacia diferente, informe a nuestra oficina a travs de MyChart de [place] o por telfono llamando al [phone] y presione la opcin 4.

## 2024-07-08 ENCOUNTER — Ambulatory Visit: Admitting: Dermatology
